# Patient Record
Sex: Male | Born: 1975 | Race: Black or African American | Hispanic: No | Marital: Married | Smoking: Never smoker
Health system: Southern US, Community
[De-identification: ages and names within clinical notes are randomized; demographics above are authoritative.]

---

## 2016-07-03 ENCOUNTER — Encounter (HOSPITAL_COMMUNITY): Payer: Self-pay

## 2016-07-03 ENCOUNTER — Emergency Department (HOSPITAL_COMMUNITY)
Admission: EM | Admit: 2016-07-03 | Discharge: 2016-07-03 | Disposition: A | Discharge status: Home / Self Care | Payer: No Typology Code available for payment source | Attending: Emergency Medicine | Admitting: Emergency Medicine

## 2016-07-03 DIAGNOSIS — S161XXA Strain of muscle, fascia and tendon at neck level, initial encounter: Secondary | ICD-10-CM | POA: Insufficient documentation

## 2016-07-03 DIAGNOSIS — Y939 Activity, unspecified: Secondary | ICD-10-CM | POA: Diagnosis not present

## 2016-07-03 DIAGNOSIS — Y999 Unspecified external cause status: Secondary | ICD-10-CM | POA: Diagnosis not present

## 2016-07-03 DIAGNOSIS — S199XXA Unspecified injury of neck, initial encounter: Secondary | ICD-10-CM | POA: Diagnosis present

## 2016-07-03 DIAGNOSIS — Y92410 Unspecified street and highway as the place of occurrence of the external cause: Secondary | ICD-10-CM | POA: Diagnosis not present

## 2016-07-03 MED ORDER — METHOCARBAMOL 500 MG PO TABS: 500.0000 mg | ORAL_TABLET | Freq: Two times a day (BID) | ORAL | 0 refills | Status: AC

## 2016-07-03 MED ORDER — NAPROXEN 500 MG PO TABS
500.0000 mg | ORAL_TABLET | Freq: Two times a day (BID) | ORAL | 0 refills | Status: AC
Start: 2016-07-03 — End: ?
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
## 2016-07-03 NOTE — Discharge Instructions (Signed)
Take it easy, but do not lay around too much as this may make the stiffness worse. Take 500 mg of naproxen every 12 hours or 800 mg of ibuprofen every 8 hours for the next 3 days. Take these medications with food to avoid upset stomach. Robaxin is a muscle relaxer and may help loosen stiff muscles. Do not take the Robaxin while driving or performing other dangerous activities. Be sure to perform the attached exercises daily to prevent long-term pain.

## 2016-09-13 ENCOUNTER — Encounter: Payer: Self-pay | Admitting: Emergency Medicine

## 2016-09-13 ENCOUNTER — Emergency Department: Payer: No Typology Code available for payment source

## 2016-09-13 ENCOUNTER — Emergency Department
Admission: EM | Admit: 2016-09-13 | Discharge: 2016-09-13 | Disposition: A | Discharge status: Home / Self Care | Payer: No Typology Code available for payment source | Attending: Emergency Medicine | Admitting: Emergency Medicine

## 2016-09-13 DIAGNOSIS — S161XXA Strain of muscle, fascia and tendon at neck level, initial encounter: Secondary | ICD-10-CM | POA: Insufficient documentation

## 2016-09-13 DIAGNOSIS — X58XXXA Exposure to other specified factors, initial encounter: Secondary | ICD-10-CM | POA: Insufficient documentation

## 2016-09-13 DIAGNOSIS — Y929 Unspecified place or not applicable: Secondary | ICD-10-CM | POA: Diagnosis not present

## 2016-09-13 DIAGNOSIS — S199XXA Unspecified injury of neck, initial encounter: Secondary | ICD-10-CM | POA: Diagnosis present

## 2016-09-13 DIAGNOSIS — M7918 Myalgia, other site: Secondary | ICD-10-CM

## 2016-09-13 DIAGNOSIS — Y939 Activity, unspecified: Secondary | ICD-10-CM | POA: Diagnosis not present

## 2016-09-13 DIAGNOSIS — Y999 Unspecified external cause status: Secondary | ICD-10-CM | POA: Diagnosis not present

## 2016-09-13 DIAGNOSIS — S161XXD Strain of muscle, fascia and tendon at neck level, subsequent encounter: Secondary | ICD-10-CM

## 2016-09-13 MED ORDER — CYCLOBENZAPRINE HCL 5 MG PO TABS: 5.0000 mg | ORAL_TABLET | Freq: Three times a day (TID) | ORAL | 0 refills | Status: AC | PRN
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
## 2016-09-13 NOTE — Discharge Instructions (Signed)
Your exam and x-ray were normal. There is no evidence of any acute injury or fracture on x-ray. Follow-up with Dr. Ernest PineHooten for continued symptoms. Apply ice and/or moist heat to the neck.
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
